# Patient Record
Sex: Female | Born: 1940 | Race: White | Hispanic: No | Marital: Married
Health system: Southern US, Community
[De-identification: ages and names within clinical notes are randomized; demographics above are authoritative.]

---

## 2004-04-23 ENCOUNTER — Ambulatory Visit: Payer: Self-pay | Admitting: *Deleted

## 2005-03-04 ENCOUNTER — Ambulatory Visit: Payer: Self-pay | Admitting: Unknown Physician Specialty

## 2006-03-23 ENCOUNTER — Ambulatory Visit (HOSPITAL_COMMUNITY): Admission: RE | Admit: 2006-03-23 | Discharge: 2006-03-24 | Payer: Self-pay | Admitting: Ophthalmology

## 2006-08-12 ENCOUNTER — Ambulatory Visit: Payer: Self-pay | Admitting: Unknown Physician Specialty

## 2007-03-07 ENCOUNTER — Ambulatory Visit: Payer: Self-pay | Admitting: Unknown Physician Specialty

## 2007-03-14 ENCOUNTER — Ambulatory Visit: Payer: Self-pay | Admitting: Nephrology

## 2009-01-03 ENCOUNTER — Ambulatory Visit: Payer: Self-pay | Admitting: Unknown Physician Specialty

## 2009-01-08 ENCOUNTER — Ambulatory Visit: Payer: Self-pay | Admitting: Unknown Physician Specialty

## 2009-01-09 ENCOUNTER — Ambulatory Visit: Payer: Self-pay | Admitting: Vascular Surgery

## 2009-07-29 ENCOUNTER — Inpatient Hospital Stay: Payer: Self-pay | Admitting: Internal Medicine

## 2009-09-12 ENCOUNTER — Inpatient Hospital Stay: Payer: Self-pay | Admitting: Internal Medicine

## 2009-10-03 ENCOUNTER — Encounter: Payer: Self-pay | Admitting: Internal Medicine

## 2009-10-06 ENCOUNTER — Encounter: Payer: Self-pay | Admitting: Internal Medicine

## 2009-10-16 ENCOUNTER — Ambulatory Visit: Payer: Self-pay | Admitting: Internal Medicine

## 2009-11-06 ENCOUNTER — Ambulatory Visit: Payer: Self-pay | Admitting: Oncology

## 2009-11-19 ENCOUNTER — Ambulatory Visit: Payer: Self-pay | Admitting: Oncology

## 2009-12-06 ENCOUNTER — Ambulatory Visit: Payer: Self-pay | Admitting: Oncology

## 2010-01-06 ENCOUNTER — Ambulatory Visit: Payer: Self-pay | Admitting: Oncology

## 2010-02-06 ENCOUNTER — Ambulatory Visit: Payer: Self-pay | Admitting: Oncology

## 2010-02-18 ENCOUNTER — Ambulatory Visit: Payer: Self-pay | Admitting: Oncology

## 2010-03-08 ENCOUNTER — Ambulatory Visit: Payer: Self-pay | Admitting: Oncology

## 2010-05-20 ENCOUNTER — Ambulatory Visit: Payer: Self-pay | Admitting: Oncology

## 2010-06-08 ENCOUNTER — Ambulatory Visit: Payer: Self-pay | Admitting: Oncology

## 2010-08-20 ENCOUNTER — Ambulatory Visit: Payer: Self-pay | Admitting: Oncology

## 2010-09-07 ENCOUNTER — Ambulatory Visit: Payer: Self-pay | Admitting: Oncology

## 2010-10-07 ENCOUNTER — Ambulatory Visit: Payer: Self-pay | Admitting: Oncology

## 2010-11-12 ENCOUNTER — Ambulatory Visit: Payer: Self-pay | Admitting: Oncology

## 2010-12-07 ENCOUNTER — Ambulatory Visit: Payer: Self-pay | Admitting: Oncology

## 2011-01-12 ENCOUNTER — Ambulatory Visit: Payer: Self-pay | Admitting: Oncology

## 2011-02-07 ENCOUNTER — Ambulatory Visit: Payer: Self-pay | Admitting: Oncology

## 2011-03-16 ENCOUNTER — Ambulatory Visit: Payer: Self-pay | Admitting: Oncology

## 2011-04-09 ENCOUNTER — Ambulatory Visit: Payer: Self-pay | Admitting: Oncology

## 2011-05-04 ENCOUNTER — Ambulatory Visit (INDEPENDENT_AMBULATORY_CARE_PROVIDER_SITE_OTHER): Payer: Medicare Other | Admitting: Ophthalmology

## 2011-05-04 DIAGNOSIS — E1165 Type 2 diabetes mellitus with hyperglycemia: Secondary | ICD-10-CM

## 2011-05-04 DIAGNOSIS — E11359 Type 2 diabetes mellitus with proliferative diabetic retinopathy without macular edema: Secondary | ICD-10-CM

## 2011-05-04 DIAGNOSIS — H43819 Vitreous degeneration, unspecified eye: Secondary | ICD-10-CM
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
## 2011-06-08 ENCOUNTER — Inpatient Hospital Stay: Payer: Self-pay | Admitting: Internal Medicine

## 2011-06-09 LAB — BASIC METABOLIC PANEL
Anion Gap: 11 (ref 7–16)
BUN: 74 mg/dL — ABNORMAL HIGH (ref 7–18)
Chloride: 94 mmol/L — ABNORMAL LOW (ref 98–107)
Co2: 31 mmol/L (ref 21–32)
Osmolality: 299 (ref 275–301)
Potassium: 3.3 mmol/L — ABNORMAL LOW (ref 3.5–5.1)

## 2011-06-09 LAB — URINALYSIS, COMPLETE
Bilirubin,UR: NEGATIVE
Blood: NEGATIVE
Ketone: NEGATIVE
Ph: 6 (ref 4.5–8.0)
Protein: NEGATIVE
RBC,UR: 2 /HPF (ref 0–5)
Specific Gravity: 1.011 (ref 1.003–1.030)

## 2011-06-09 LAB — CBC WITH DIFFERENTIAL/PLATELET
Basophil %: 0.2 %
Lymphocyte %: 10.3 %
MCHC: 33.4 g/dL (ref 32.0–36.0)
Neutrophil %: 79.3 %
Platelet: 205 10*3/uL (ref 150–440)
RDW: 14.3 % (ref 11.5–14.5)

## 2011-06-09 LAB — HEMOGLOBIN A1C: Hemoglobin A1C: 6.9 % — ABNORMAL HIGH (ref 4.2–6.3)

## 2011-06-30 ENCOUNTER — Ambulatory Visit: Payer: Self-pay | Admitting: Oncology

## 2011-06-30 LAB — CBC CANCER CENTER
HCT: 31.6 % — ABNORMAL LOW (ref 35.0–47.0)
MCH: 29.6 pg (ref 26.0–34.0)
MCHC: 33.7 g/dL (ref 32.0–36.0)
MCV: 88 fL (ref 80–100)
RDW: 14.8 % — ABNORMAL HIGH (ref 11.5–14.5)

## 2011-06-30 LAB — IRON AND TIBC
Iron Bind.Cap.(Total): 256 ug/dL (ref 250–450)
Iron: 65 ug/dL (ref 50–170)

## 2011-06-30 LAB — FERRITIN: Ferritin (ARMC): 847 ng/mL — ABNORMAL HIGH (ref 8–388)

## 2011-07-10 ENCOUNTER — Ambulatory Visit: Payer: Self-pay | Admitting: Oncology

## 2011-08-29 ENCOUNTER — Emergency Department: Payer: Self-pay | Admitting: Emergency Medicine

## 2011-08-29 LAB — TROPONIN I: Troponin-I: 0.35 ng/mL — ABNORMAL HIGH

## 2011-08-29 LAB — BASIC METABOLIC PANEL
Anion Gap: 14 (ref 7–16)
Calcium, Total: 9.5 mg/dL (ref 8.5–10.1)
Chloride: 95 mmol/L — ABNORMAL LOW (ref 98–107)
Co2: 30 mmol/L (ref 21–32)
Creatinine: 3.03 mg/dL — ABNORMAL HIGH (ref 0.60–1.30)
EGFR (African American): 20 — ABNORMAL LOW
EGFR (Non-African Amer.): 16 — ABNORMAL LOW
Osmolality: 319 (ref 275–301)

## 2011-08-29 LAB — CBC
HCT: 30.8 % — ABNORMAL LOW (ref 35.0–47.0)
HGB: 10.3 g/dL — ABNORMAL LOW (ref 12.0–16.0)
MCH: 29 pg (ref 26.0–34.0)
MCHC: 33.4 g/dL (ref 32.0–36.0)
MCV: 87 fL (ref 80–100)

## 2011-08-29 LAB — PROTIME-INR: Prothrombin Time: 14.2 secs (ref 11.5–14.7)

## 2011-08-29 LAB — CK TOTAL AND CKMB (NOT AT ARMC): CK-MB: 14.4 ng/mL — ABNORMAL HIGH (ref 0.5–3.6)

## 2011-10-07 DEATH — deceased

## 2011-11-04 ENCOUNTER — Ambulatory Visit (INDEPENDENT_AMBULATORY_CARE_PROVIDER_SITE_OTHER): Payer: 59 | Admitting: Ophthalmology

## 2013-05-29 ENCOUNTER — Other Ambulatory Visit: Payer: Self-pay | Admitting: Otolaryngology
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
## 2014-09-30 NOTE — Consult Note (Signed)
PATIENT NAME:  [name], [name] DATE OF BIRTH:  [birth year]  DATE OF CONSULTATION:  06/08/2011  REFERRING PHYSICIAN:   CONSULTING PHYSICIAN:  [name]. Carol Aneesha Holloran, MD  HISTORY OF PRESENT ILLNESS: The patient is a 74 year old white female with a history of diabetes, chronic kidney disease, and coronary artery disease who presented with a 48 hour history of right cheek and ear pain. The patient reports that her hearing is normal and pain is primarily in the right cheek and ear. No dizziness, vertigo, or tinnitus.   DRUG ALLERGIES: Multiple.  PAST MEDICAL HISTORY:  1. Diabetes mellitus. 2. Coronary artery disease. 3. History of congestive heart failure. 4. Chronic kidney disease. 5. Anemia.  PHYSICAL EXAMINATION:   GENERAL: Pleasant 74 year old in no acute distress.   EARS: The right ear is erythematous. The canal was cleaned of cerumen to allow for examination of the tympanic membrane, which is clear. The canal is noninflamed.  HEAD AND FACE: There is erythema primarily centered on the tail of the parotid. This is tender to palpation. No purulence is expressed through the Stensen's duct.    IMPRESSION: Right parotitis with cellulitis and no evidence specifically of malignant otitis externa.  RECOMMENDATIONS: No eardrops are necessary at this point. Given the patient's complex medical allergies unfortunately none of the fluoroquinolones can be used. Clindamycin has been     chosen. We will follow the clinical response to this. The patient will need to be well hydrated and use sialagogues. She will be followed as an inpatient by our service.  ____________________________ [name]. Carol BaronMadison Jahmier Willadsen, MD [name]:slb D: 06/08/2011 23:15:50 ET T: 06/10/2011 09:14:21 ET JOB#: [id number]  cc: [name]. Carol Carol Tangeman, MD, <Dictator> [name] - [place] [facility] Wendee CoppJMADISON Carol Hoge MD ELECTRONICALLY SIGNED 07/17/2011 7:30

## 2014-09-30 NOTE — Discharge Summary (Signed)
PATIENT NAME:  [name], Luretha MR#:  [id number] DATE OF BIRTH:  1940-11-30  DATE OF ADMISSION:  06/08/2011 DATE OF DISCHARGE:  06/11/2011  ADMITTING DIAGNOSIS: Right parotitis and cellulitis.   CONSULTATION: ENT consult with Dr. Gertie BaronMadison Clark.   DISCHARGE DIAGNOSES: 1. Acute parotitis with cellulitis on the right side.  2. Hypertension.  3. Diabetes mellitus, type II.  4. Chronic kidney disease stage III.  5. History of systolic heart failure.   DISCHARGE MEDICATIONS:  1. Aspirin 325 mg p.o. daily.  2. Aranesp injection once a month.  3. Xanax 0.5 mg every six hours as needed.  4. Xalatan eyedrops 0.005% to affected eye 2 times a day. 5. Hydralazine 25 mg p.o. 3 times daily. 6. Isosorbide dinitrate 10 mg p.o. t.i.d.  7. Metolazone 2.5 mg p.o. daily.  8. NovoLog 10 units before meals 3 times a day. 9. Lasix 80 mg take 2 tablets at breakfast and 1-1/2 tablet at supper. 10. KCl 10 milliequivalents two tablets at lunch and 10 milliequivalents one tablet at supper. 11. Protonix 40 mg p.o. b.i.d.   NEW MEDICATION: Clindamycin 600 mg every six hours for 10 days.   FOLLOW-UP: The patient is to follow-up with Dr. Gertie BaronMadison Clark in one week and keep the regular appointments that are scheduled to follow-up with nephrologist.   ALLERGIES: The patient is allergic to multiple antibiotics including amoxicillin, Augmentin, Crestor, erythromycin, Levaquin, Niaspan ER.   HOSPITAL COURSE:  921. A 74 year old female with history of hypertension, diabetes, chronic kidney disease, coronary artery disease, who came in because of ear pain. [facility] and found to have swelling around the right parotid region and was referred here for further evaluation and getting IV antibiotics. Because of multiple allergies to antibiotics, she was admitted for right parotitis. She was started on IV clindamycin and temperature 100.7 with the rest of vitals stable. WBC elevated on admission to 14.1  and she was seen by Dr. Chestine Sporelark, ENT. The patient showed good improvement in the symptoms as well as pain. She never had trouble breathing and also, she did not have any difficulty swallowing. She is getting clindamycin 900 mg IV every six hours and blood cultures have been negative. Her white count also came down to 10.4 on 01/01. She is going to be discharged today with p.o. clindamycin 600 mg every six hours.  2. She has history of heart failure. She is on high dose Lasix and metolazone. She had an echo in 2011 and that showed ejection fraction around 45%. After that, she was not here in the hospital and she did not have any congestive heart failure symptoms during the hospital stay and remained euvolemic with sats of 96% and blood pressure 109/62 and continued on her home medications.  3. Chronic kidney disease stage III. The patient's creatinine has been around 2.58.  4. History of gastroesophageal reflux disease. On PPIs.  5. History of anemia secondary to chronic renal insufficiency and also iron deficiency anemia.   CONDITION: Stable.   TIME SPENT ON DISCHARGE PREPARATION: More than 30 minutes.    ____________________________ Katha HammingSnehalatha Fiorella Hanahan, MD sk:ap D: 06/11/2011 12:02:54 ET T: 06/12/2011 13:24:21 ET JOB#: [id number]  cc: Katha HammingSnehalatha Alisandra Son, MD, <Dictator> Zackery BarefootJ. Madison Clark, MD  Katha HammingSNEHALATHA Kerby Borner MD ELECTRONICALLY SIGNED 06/14/2011 21:49
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
